# Patient Record
Sex: Female | Born: 1940 | Race: White | Hispanic: No | Smoking: Former smoker
Health system: Southern US, Community
[De-identification: ages and names within clinical notes are randomized; demographics above are authoritative.]

## PROBLEM LIST (undated history)

## (undated) DIAGNOSIS — Z5189 Encounter for other specified aftercare: Secondary | ICD-10-CM

## (undated) DIAGNOSIS — E785 Hyperlipidemia, unspecified: Secondary | ICD-10-CM

## (undated) DIAGNOSIS — H269 Unspecified cataract: Secondary | ICD-10-CM

## (undated) DIAGNOSIS — H409 Unspecified glaucoma: Secondary | ICD-10-CM

## (undated) DIAGNOSIS — T7840XA Allergy, unspecified, initial encounter: Secondary | ICD-10-CM

## (undated) DIAGNOSIS — M8580 Other specified disorders of bone density and structure, unspecified site: Secondary | ICD-10-CM

## (undated) DIAGNOSIS — M1990 Unspecified osteoarthritis, unspecified site: Secondary | ICD-10-CM

## (undated) DIAGNOSIS — K5900 Constipation, unspecified: Secondary | ICD-10-CM

## (undated) DIAGNOSIS — M810 Age-related osteoporosis without current pathological fracture: Secondary | ICD-10-CM

## (undated) DIAGNOSIS — I10 Essential (primary) hypertension: Secondary | ICD-10-CM

## (undated) HISTORY — DX: Allergy, unspecified, initial encounter: T78.40XA

## (undated) HISTORY — DX: Essential (primary) hypertension: I10

## (undated) HISTORY — DX: Other specified disorders of bone density and structure, unspecified site: M85.80

## (undated) HISTORY — DX: Unspecified glaucoma: H40.9

## (undated) HISTORY — DX: Age-related osteoporosis without current pathological fracture: M81.0

## (undated) HISTORY — DX: Constipation, unspecified: K59.00

## (undated) HISTORY — DX: Unspecified osteoarthritis, unspecified site: M19.90

## (undated) HISTORY — DX: Hyperlipidemia, unspecified: E78.5

## (undated) HISTORY — DX: Encounter for other specified aftercare: Z51.89

## (undated) HISTORY — DX: Unspecified cataract: H26.9

---

## 1972-07-26 HISTORY — PX: TUBAL LIGATION: SHX77

## 1989-07-26 HISTORY — PX: DILATION AND CURETTAGE OF UTERUS: SHX78

## 1999-06-23 ENCOUNTER — Encounter: Admission: RE | Admit: 1999-06-23 | Discharge: 1999-06-23 | Payer: Self-pay | Admitting: Obstetrics and Gynecology

## 1999-06-23 ENCOUNTER — Encounter: Payer: Self-pay | Admitting: Obstetrics and Gynecology

## 2000-06-24 ENCOUNTER — Encounter: Admission: RE | Admit: 2000-06-24 | Discharge: 2000-06-24 | Payer: Self-pay | Admitting: Internal Medicine

## 2000-06-24 ENCOUNTER — Encounter: Payer: Self-pay | Admitting: Internal Medicine

## 2001-06-26 ENCOUNTER — Encounter: Payer: Self-pay | Admitting: Obstetrics and Gynecology

## 2001-06-26 ENCOUNTER — Encounter: Admission: RE | Admit: 2001-06-26 | Discharge: 2001-06-26 | Payer: Self-pay | Admitting: Obstetrics and Gynecology

## 2002-07-04 ENCOUNTER — Encounter: Admission: RE | Admit: 2002-07-04 | Discharge: 2002-07-04 | Payer: Self-pay | Admitting: Obstetrics and Gynecology

## 2002-07-04 ENCOUNTER — Encounter: Payer: Self-pay | Admitting: Obstetrics and Gynecology

## 2003-07-12 ENCOUNTER — Encounter: Admission: RE | Admit: 2003-07-12 | Discharge: 2003-07-12 | Payer: Self-pay | Admitting: Obstetrics and Gynecology

## 2003-07-17 ENCOUNTER — Encounter: Admission: RE | Admit: 2003-07-17 | Discharge: 2003-07-17 | Payer: Self-pay | Admitting: Obstetrics and Gynecology

## 2004-08-06 ENCOUNTER — Encounter: Admission: RE | Admit: 2004-08-06 | Discharge: 2004-08-06 | Payer: Self-pay | Admitting: Obstetrics and Gynecology

## 2005-08-09 ENCOUNTER — Encounter: Admission: RE | Admit: 2005-08-09 | Discharge: 2005-08-09 | Payer: Self-pay | Admitting: Obstetrics and Gynecology

## 2006-08-10 ENCOUNTER — Encounter: Admission: RE | Admit: 2006-08-10 | Discharge: 2006-08-10 | Payer: Self-pay | Admitting: Obstetrics and Gynecology

## 2007-04-18 ENCOUNTER — Ambulatory Visit: Payer: Self-pay | Admitting: Internal Medicine

## 2007-08-31 ENCOUNTER — Encounter: Admission: RE | Admit: 2007-08-31 | Discharge: 2007-08-31 | Payer: Self-pay | Admitting: Obstetrics and Gynecology

## 2008-09-03 ENCOUNTER — Encounter: Admission: RE | Admit: 2008-09-03 | Discharge: 2008-09-03 | Payer: Self-pay | Admitting: Obstetrics and Gynecology

## 2009-03-24 ENCOUNTER — Ambulatory Visit: Payer: Self-pay | Admitting: Internal Medicine

## 2009-04-15 ENCOUNTER — Ambulatory Visit: Payer: Self-pay | Admitting: Internal Medicine

## 2009-04-15 ENCOUNTER — Encounter: Payer: Self-pay | Admitting: Internal Medicine

## 2009-04-15 HISTORY — PX: COLONOSCOPY: SHX174

## 2009-04-15 HISTORY — PX: POLYPECTOMY: SHX149

## 2009-04-17 ENCOUNTER — Encounter: Payer: Self-pay | Admitting: Internal Medicine

## 2009-09-08 ENCOUNTER — Encounter: Admission: RE | Admit: 2009-09-08 | Discharge: 2009-09-08 | Payer: Self-pay | Admitting: Obstetrics and Gynecology

## 2010-08-15 ENCOUNTER — Encounter: Payer: Self-pay | Admitting: Obstetrics and Gynecology

## 2010-08-15 ENCOUNTER — Other Ambulatory Visit: Payer: Self-pay | Admitting: Obstetrics and Gynecology

## 2010-08-15 DIAGNOSIS — Z1239 Encounter for other screening for malignant neoplasm of breast: Secondary | ICD-10-CM

## 2010-09-11 ENCOUNTER — Ambulatory Visit
Admission: RE | Admit: 2010-09-11 | Discharge: 2010-09-11 | Disposition: A | Discharge status: Home / Self Care | Payer: MEDICARE | Source: Ambulatory Visit | Attending: Obstetrics and Gynecology | Admitting: Obstetrics and Gynecology

## 2010-09-11 DIAGNOSIS — Z1239 Encounter for other screening for malignant neoplasm of breast: Secondary | ICD-10-CM

## 2011-08-09 ENCOUNTER — Other Ambulatory Visit: Payer: Self-pay | Admitting: Obstetrics and Gynecology

## 2011-08-09 DIAGNOSIS — Z1231 Encounter for screening mammogram for malignant neoplasm of breast: Secondary | ICD-10-CM

## 2011-09-14 ENCOUNTER — Ambulatory Visit
Admission: RE | Admit: 2011-09-14 | Discharge: 2011-09-14 | Disposition: A | Discharge status: Home / Self Care | Payer: Medicare Other | Source: Ambulatory Visit | Attending: Obstetrics and Gynecology | Admitting: Obstetrics and Gynecology

## 2011-09-14 DIAGNOSIS — Z1231 Encounter for screening mammogram for malignant neoplasm of breast: Secondary | ICD-10-CM

## 2011-09-21 ENCOUNTER — Other Ambulatory Visit: Payer: Self-pay | Admitting: Obstetrics and Gynecology

## 2011-09-21 DIAGNOSIS — Z803 Family history of malignant neoplasm of breast: Secondary | ICD-10-CM

## 2011-09-28 ENCOUNTER — Ambulatory Visit
Admission: RE | Admit: 2011-09-28 | Discharge: 2011-09-28 | Disposition: A | Discharge status: Home / Self Care | Payer: Medicare Other | Source: Ambulatory Visit | Attending: Obstetrics and Gynecology | Admitting: Obstetrics and Gynecology

## 2011-09-28 DIAGNOSIS — Z803 Family history of malignant neoplasm of breast: Secondary | ICD-10-CM

## 2011-09-28 MED ORDER — GADOBENATE DIMEGLUMINE 529 MG/ML IV SOLN
10.0000 mL | Freq: Once | INTRAVENOUS | Status: AC | PRN
  Administered 2011-09-28: 10 mL via INTRAVENOUS

## 2012-08-28 ENCOUNTER — Other Ambulatory Visit: Payer: Self-pay | Admitting: Obstetrics and Gynecology

## 2012-08-28 DIAGNOSIS — Z1231 Encounter for screening mammogram for malignant neoplasm of breast: Secondary | ICD-10-CM

## 2012-10-03 ENCOUNTER — Ambulatory Visit
Admission: RE | Admit: 2012-10-03 | Discharge: 2012-10-03 | Disposition: A | Discharge status: Home / Self Care | Payer: Medicare Other | Source: Ambulatory Visit | Attending: Obstetrics and Gynecology | Admitting: Obstetrics and Gynecology

## 2012-10-03 DIAGNOSIS — Z1231 Encounter for screening mammogram for malignant neoplasm of breast: Secondary | ICD-10-CM

## 2012-10-04 ENCOUNTER — Other Ambulatory Visit: Payer: Self-pay | Admitting: Obstetrics and Gynecology

## 2012-10-04 DIAGNOSIS — R928 Other abnormal and inconclusive findings on diagnostic imaging of breast: Secondary | ICD-10-CM

## 2012-10-10 ENCOUNTER — Ambulatory Visit
Admission: RE | Admit: 2012-10-10 | Discharge: 2012-10-10 | Disposition: A | Discharge status: Home / Self Care | Payer: Medicare Other | Source: Ambulatory Visit | Attending: Obstetrics and Gynecology | Admitting: Obstetrics and Gynecology

## 2012-10-10 DIAGNOSIS — R928 Other abnormal and inconclusive findings on diagnostic imaging of breast: Secondary | ICD-10-CM

## 2013-09-05 ENCOUNTER — Other Ambulatory Visit: Payer: Self-pay

## 2013-09-05 DIAGNOSIS — Z1231 Encounter for screening mammogram for malignant neoplasm of breast: Secondary | ICD-10-CM

## 2013-10-12 ENCOUNTER — Ambulatory Visit
Admission: RE | Admit: 2013-10-12 | Discharge: 2013-10-12 | Disposition: A | Discharge status: Home / Self Care | Payer: Medicare Other | Source: Ambulatory Visit

## 2013-10-12 DIAGNOSIS — Z1231 Encounter for screening mammogram for malignant neoplasm of breast: Secondary | ICD-10-CM

## 2014-02-21 ENCOUNTER — Encounter: Payer: Self-pay | Admitting: Internal Medicine

## 2014-04-26 ENCOUNTER — Encounter: Payer: Self-pay | Admitting: Internal Medicine

## 2014-08-05 ENCOUNTER — Encounter: Payer: Self-pay | Admitting: Internal Medicine

## 2014-09-10 ENCOUNTER — Other Ambulatory Visit: Payer: Self-pay

## 2014-09-10 DIAGNOSIS — Z1231 Encounter for screening mammogram for malignant neoplasm of breast: Secondary | ICD-10-CM

## 2014-09-13 ENCOUNTER — Ambulatory Visit (AMBULATORY_SURGERY_CENTER): Payer: Self-pay | Admitting: *Deleted

## 2014-09-13 VITALS — Ht 62.0 in | Wt 133.0 lb

## 2014-09-13 DIAGNOSIS — Z86010 Personal history of colonic polyps: Secondary | ICD-10-CM

## 2014-09-13 MED ORDER — MOVIPREP 100 G PO SOLR: 1.0000 | Freq: Once | ORAL | Status: DC

## 2014-09-13 NOTE — Progress Notes (Signed)
No egg or soy allergy No diet pills No home 02 use No issues with past sedation. Pt states she has hard stools, she has 5-6 a week and doesn't take much for her constipation to go. Pt declined emmi video

## 2014-09-17 ENCOUNTER — Telehealth: Payer: Self-pay | Admitting: Internal Medicine

## 2014-09-17 NOTE — Telephone Encounter (Signed)
Pt states prep is 86.00 with her insurance and she cannot swing that price .  Called pharmacy and gave them coupon information [id number]      [id number]        [id number]     IO=[phone]     Pt aware this was called and for her to pick up at no cost to her   Marijean Niemann

## 2014-09-27 ENCOUNTER — Encounter: Payer: Self-pay | Admitting: Internal Medicine

## 2014-09-27 ENCOUNTER — Ambulatory Visit (AMBULATORY_SURGERY_CENTER): Payer: Medicare Other | Admitting: Internal Medicine

## 2014-09-27 VITALS — BP 121/64 | HR 57 | Temp 98.3°F | Resp 24 | Ht 62.0 in | Wt 133.0 lb

## 2014-09-27 DIAGNOSIS — D122 Benign neoplasm of ascending colon: Secondary | ICD-10-CM

## 2014-09-27 DIAGNOSIS — Z86010 Personal history of colonic polyps: Secondary | ICD-10-CM

## 2014-09-27 MED ORDER — SODIUM CHLORIDE 0.9 % IV SOLN: 500.0000 mL | INTRAVENOUS | Status: DC

## 2014-09-27 NOTE — Patient Instructions (Signed)
Discharge instructions given. Handout on polyps. Resume previous medications. YOU HAD AN ENDOSCOPIC PROCEDURE TODAY AT THE [facility]:   Refer to the procedure report that was given to you for any specific questions about what was found during the examination.  If the procedure report does not answer your questions, please call your gastroenterologist to clarify.  If you requested that your care partner not be given the details of your procedure findings, then the procedure report has been included in a sealed envelope for you to review at your convenience later.  YOU SHOULD EXPECT: Some feelings of bloating in the abdomen. Passage of more gas than usual.  Walking can help get rid of the air that was put into your GI tract during the procedure and reduce the bloating. If you had a lower endoscopy (such as a colonoscopy or flexible sigmoidoscopy) you may notice spotting of blood in your stool or on the toilet paper. If you underwent a bowel prep for your procedure, you may not have a normal bowel movement for a few days.  Please Note:  You might notice some irritation and congestion in your nose or some drainage.  This is from the oxygen used during your procedure.  There is no need for concern and it should clear up in a day or so.  SYMPTOMS TO REPORT IMMEDIATELY:   Following lower endoscopy (colonoscopy or flexible sigmoidoscopy):  Excessive amounts of blood in the stool  Significant tenderness or worsening of abdominal pains  Swelling of the abdomen that is new, acute  Fever of 100F or higher   For urgent or emergent issues, a gastroenterologist can be reached at any hour by calling [phone].   DIET: Your first meal following the procedure should be a small meal and then it is ok to progress to your normal diet. Heavy or fried foods are harder to digest and may make you feel nauseous or bloated.  Likewise, meals heavy in dairy and vegetables can increase bloating.  Drink  plenty of fluids but you should avoid alcoholic beverages for 24 hours.  ACTIVITY:  You should plan to take it easy for the rest of today and you should NOT DRIVE or use heavy machinery until tomorrow (because of the sedation medicines used during the test).    FOLLOW UP: Our staff will call the number listed on your records the next business day following your procedure to check on you and address any questions or concerns that you may have regarding the information given to you following your procedure. If we do not reach you, we will leave a message.  However, if you are feeling well and you are not experiencing any problems, there is no need to return our call.  We will assume that you have returned to your regular daily activities without incident.  If any biopsies were taken you will be contacted by phone or by letter within the next 1-3 weeks.  Please call us at [phone] if you have not heard about the biopsies in 3 weeks.    SIGNATURES/CONFIDENTIALITY: You and/or your care partner have signed paperwork which will be entered into your electronic medical record.  These signatures attest to the fact that that the information above on your After Visit Summary has been reviewed and is understood.  Full responsibility of the confidentiality of this discharge information lies with you and/or your care-partner. 

## 2014-09-27 NOTE — Op Note (Signed)
[place]  [address] [place], [address]   COLONOSCOPY PROCEDURE REPORT  PATIENT: Susan Boyd, Susan Boyd  MR#: [id number] BIRTHDATE: 1940-12-16 , 73  yrs. old GENDER: female ENDOSCOPIST: Eustace Quail, MD REFERRED [id number] Program Recall PROCEDURE DATE:  09/27/2014 PROCEDURE:   Colonoscopy with snare polypectomy x 1 First Screening Colonoscopy - Avg.  risk and is 50 yrs.  old or older - No.  Prior Negative Screening - Now for repeat screening. N/A  History of Adenoma - Now for follow-up colonoscopy & has been > or = to 3 yrs.  Yes hx of adenoma.  Has been 3 or more years since last colonoscopy.  Polyps Removed Today? Yes. ASA CLASS:   Class II INDICATIONS:follow up of adenomatous colonic polyp(s). . Index exam 2003 (negative); 2010 (tubular adenoma) MEDICATIONS: Monitored anesthesia care and Propofol 240 mg IV  DESCRIPTION OF PROCEDURE:   After the risks benefits and alternatives of the procedure were thoroughly explained, informed consent was obtained.  The digital rectal exam revealed no abnormalities of the rectum.   The [id number]  endoscope was introduced through the anus and advanced to the cecum, which was identified by both the appendix and ileocecal valve. No adverse events experienced.   The quality of the prep was excellent, using MoviPrep  The instrument was then slowly withdrawn as the colon was fully examined.    COLON FINDINGS: A single polyp measuring 2 mm in size was found in the ascending colon.  A polypectomy was performed with a cold snare.  The resection was complete, the polyp tissue was completely retrieved and sent to histology.   The examination was otherwise normal.  Retroflexed views revealed no abnormalities. The time to cecum=4 minutes 51 seconds.  Withdrawal time=9 minutes 0 seconds. The scope was withdrawn and the procedure completed. COMPLICATIONS: There were no immediate complications.  ENDOSCOPIC  IMPRESSION: 1.   Single polyp measuring 2 mm in size was found in the ascending colon; polypectomy was performed with a cold snare 2.   The examination was otherwise normal  RECOMMENDATIONS: 1. Follow up colonoscopy in 5 years  eSigned:  Eustace Quail, MD 09/27/2014 10:46 AM   cc: Burnard Bunting, MD and The Patient

## 2014-09-27 NOTE — Progress Notes (Signed)
To recovery, report to McCoy, RN, VSS 

## 2014-09-27 NOTE — Progress Notes (Signed)
Called to room to assist during endoscopic procedure.  Patient ID and intended procedure confirmed with present staff. Received instructions for my participation in the procedure from the performing physician.  

## 2014-09-30 ENCOUNTER — Telehealth: Payer: Self-pay

## 2014-09-30 NOTE — Telephone Encounter (Signed)
  Follow up Call-  Call back number 09/27/2014  Post procedure Call Back phone  # [phone]  Permission to leave phone message Yes     Patient questions:  Do you have a fever, pain , or abdominal swelling? No. Pain Score  0 *  Have you tolerated food without any problems? Yes.    Have you been able to return to your normal activities? Yes.    Do you have any questions about your discharge instructions: Diet   No. Medications  No. Follow up visit  No.  Do you have questions or concerns about your Care? No.  Actions: * If pain score is 4 or above: No action needed, pain <4.

## 2014-10-01 ENCOUNTER — Encounter: Payer: Self-pay | Admitting: Internal Medicine

## 2014-10-15 ENCOUNTER — Ambulatory Visit
Admission: RE | Admit: 2014-10-15 | Discharge: 2014-10-15 | Disposition: A | Discharge status: Home / Self Care | Payer: Medicare Other | Source: Ambulatory Visit

## 2014-10-15 ENCOUNTER — Other Ambulatory Visit: Payer: Self-pay

## 2014-10-15 DIAGNOSIS — Z1231 Encounter for screening mammogram for malignant neoplasm of breast: Secondary | ICD-10-CM

## 2015-09-08 ENCOUNTER — Other Ambulatory Visit: Payer: Self-pay

## 2015-09-08 DIAGNOSIS — Z1231 Encounter for screening mammogram for malignant neoplasm of breast: Secondary | ICD-10-CM

## 2015-10-20 ENCOUNTER — Ambulatory Visit
Admission: RE | Admit: 2015-10-20 | Discharge: 2015-10-20 | Disposition: A | Discharge status: Home / Self Care | Payer: Medicare Other | Source: Ambulatory Visit

## 2015-10-20 DIAGNOSIS — Z1231 Encounter for screening mammogram for malignant neoplasm of breast: Secondary | ICD-10-CM

## 2015-10-22 ENCOUNTER — Other Ambulatory Visit: Payer: Self-pay | Admitting: Obstetrics and Gynecology

## 2015-10-22 DIAGNOSIS — R928 Other abnormal and inconclusive findings on diagnostic imaging of breast: Secondary | ICD-10-CM

## 2015-10-30 ENCOUNTER — Ambulatory Visit
Admission: RE | Admit: 2015-10-30 | Discharge: 2015-10-30 | Disposition: A | Discharge status: Home / Self Care | Payer: Medicare Other | Source: Ambulatory Visit | Attending: Obstetrics and Gynecology | Admitting: Obstetrics and Gynecology

## 2015-10-30 DIAGNOSIS — R928 Other abnormal and inconclusive findings on diagnostic imaging of breast: Secondary | ICD-10-CM

## 2016-09-16 ENCOUNTER — Other Ambulatory Visit: Payer: Self-pay | Admitting: Internal Medicine

## 2016-09-16 DIAGNOSIS — Z1231 Encounter for screening mammogram for malignant neoplasm of breast: Secondary | ICD-10-CM

## 2016-11-02 ENCOUNTER — Ambulatory Visit
Admission: RE | Admit: 2016-11-02 | Discharge: 2016-11-02 | Disposition: A | Discharge status: Home / Self Care | Payer: Medicare Other | Source: Ambulatory Visit | Attending: Internal Medicine | Admitting: Internal Medicine

## 2016-11-02 DIAGNOSIS — Z1231 Encounter for screening mammogram for malignant neoplasm of breast: Secondary | ICD-10-CM

## 2017-09-22 ENCOUNTER — Other Ambulatory Visit: Payer: Self-pay | Admitting: Internal Medicine

## 2017-09-22 DIAGNOSIS — Z1231 Encounter for screening mammogram for malignant neoplasm of breast: Secondary | ICD-10-CM

## 2017-11-15 ENCOUNTER — Ambulatory Visit
Admission: RE | Admit: 2017-11-15 | Discharge: 2017-11-15 | Disposition: A | Discharge status: Home / Self Care | Payer: Medicare Other | Source: Ambulatory Visit | Attending: Internal Medicine | Admitting: Internal Medicine

## 2017-11-15 DIAGNOSIS — Z1231 Encounter for screening mammogram for malignant neoplasm of breast: Secondary | ICD-10-CM

## 2018-10-04 ENCOUNTER — Other Ambulatory Visit: Payer: Self-pay | Admitting: Internal Medicine

## 2018-10-04 DIAGNOSIS — Z1231 Encounter for screening mammogram for malignant neoplasm of breast: Secondary | ICD-10-CM

## 2018-11-17 ENCOUNTER — Ambulatory Visit: Payer: Medicare Other

## 2019-01-10 ENCOUNTER — Ambulatory Visit
Admission: RE | Admit: 2019-01-10 | Discharge: 2019-01-10 | Disposition: A | Discharge status: Home / Self Care | Payer: Medicare Other | Source: Ambulatory Visit | Attending: Internal Medicine | Admitting: Internal Medicine

## 2019-01-10 ENCOUNTER — Other Ambulatory Visit: Payer: Self-pay

## 2019-01-10 DIAGNOSIS — Z1231 Encounter for screening mammogram for malignant neoplasm of breast: Secondary | ICD-10-CM

## 2019-08-25 ENCOUNTER — Ambulatory Visit: Payer: Medicare Other

## 2019-08-27 ENCOUNTER — Ambulatory Visit: Payer: Medicare Other

## 2019-08-31 ENCOUNTER — Ambulatory Visit: Payer: Medicare Other | Attending: Internal Medicine

## 2019-08-31 DIAGNOSIS — Z23 Encounter for immunization: Secondary | ICD-10-CM | POA: Insufficient documentation

## 2019-08-31 NOTE — Progress Notes (Signed)
   Covid-19 Vaccination Clinic  Name:  Susan Boyd    MRN: [id number] DOB: 1941/01/17  08/31/2019  [name] was observed post Covid-19 immunization for 15 minutes without incidence. She was provided with Vaccine Information Sheet and instruction to access the V-Safe system.   [name] was instructed to call 911 with any severe reactions post vaccine: Marland Kitchen Difficulty breathing  . Swelling of your face and throat  . A fast heartbeat  . A bad rash all over your body  . Dizziness and weakness    Immunizations Administered    Name Date Dose VIS Date Route   Pfizer COVID-19 Vaccine 08/31/2019  2:08 PM 0.3 mL 07/06/2019 Intramuscular   Manufacturer: [place]   Lot: [id number]   [place]: [id number]

## 2019-09-25 ENCOUNTER — Ambulatory Visit: Payer: Medicare Other | Attending: Internal Medicine

## 2019-09-25 DIAGNOSIS — Z23 Encounter for immunization: Secondary | ICD-10-CM

## 2019-09-25 NOTE — Progress Notes (Signed)
   Covid-19 Vaccination Clinic  Name:  Susan Boyd    MRN: [id number] DOB: 03/23/1941  09/25/2019  Susan Boyd was observed post Covid-19 immunization for 15 minutes without incident. She was provided with Vaccine Information Sheet and instruction to access the V-Safe system.   Susan Boyd was instructed to call 911 with any severe reactions post vaccine: Marland Kitchen Difficulty breathing  . Swelling of face and throat  . A fast heartbeat  . A bad rash all over body  . Dizziness and weakness   Immunizations Administered    Name Date Dose VIS Date Route   Pfizer COVID-19 Vaccine 09/25/2019  2:04 PM 0.3 mL 07/06/2019 Intramuscular   Manufacturer: [place]   Lot: [id number]   [place]: [id number]

## 2019-11-08 ENCOUNTER — Encounter: Payer: Self-pay | Admitting: Internal Medicine

## 2019-12-03 ENCOUNTER — Other Ambulatory Visit: Payer: Self-pay | Admitting: Internal Medicine

## 2019-12-03 DIAGNOSIS — Z1231 Encounter for screening mammogram for malignant neoplasm of breast: Secondary | ICD-10-CM

## 2019-12-12 ENCOUNTER — Ambulatory Visit (AMBULATORY_SURGERY_CENTER): Payer: Self-pay | Admitting: *Deleted

## 2019-12-12 ENCOUNTER — Other Ambulatory Visit: Payer: Self-pay

## 2019-12-12 VITALS — Temp 98.1°F | Ht 62.0 in | Wt 134.0 lb

## 2019-12-12 DIAGNOSIS — Z86010 Personal history of colonic polyps: Secondary | ICD-10-CM

## 2019-12-12 MED ORDER — NA SULFATE-K SULFATE-MG SULF 17.5-3.13-1.6 GM/177ML PO SOLN: 1.0000 | Freq: Once | ORAL | 0 refills | Status: AC

## 2019-12-12 NOTE — Progress Notes (Signed)

## 2019-12-14 ENCOUNTER — Encounter: Payer: Self-pay | Admitting: Internal Medicine

## 2019-12-26 ENCOUNTER — Other Ambulatory Visit: Payer: Self-pay

## 2019-12-26 ENCOUNTER — Encounter: Payer: Self-pay | Admitting: Internal Medicine

## 2019-12-26 ENCOUNTER — Ambulatory Visit (AMBULATORY_SURGERY_CENTER): Payer: Medicare Other | Admitting: Internal Medicine

## 2019-12-26 VITALS — BP 144/58 | HR 56 | Temp 97.3°F | Resp 11 | Ht 62.0 in | Wt 132.0 lb

## 2019-12-26 DIAGNOSIS — Z86010 Personal history of colonic polyps: Secondary | ICD-10-CM

## 2019-12-26 MED ORDER — SODIUM CHLORIDE 0.9 % IV SOLN: 500.0000 mL | Freq: Once | INTRAVENOUS | Status: DC

## 2019-12-26 NOTE — Progress Notes (Signed)
Vitals-DT  Pt's states no medical or surgical changes since previsit or office visit.  

## 2019-12-26 NOTE — Patient Instructions (Signed)
YOU HAD AN ENDOSCOPIC PROCEDURE TODAY AT THE [facility]:   Refer to the procedure report that was given to you for any specific questions about what was found during the examination.  If the procedure report does not answer your questions, please call your gastroenterologist to clarify.  If you requested that your care partner not be given the details of your procedure findings, then the procedure report has been included in a sealed envelope for you to review at your convenience later.  YOU SHOULD EXPECT: Some feelings of bloating in the abdomen. Passage of more gas than usual.  Walking can help get rid of the air that was put into your GI tract during the procedure and reduce the bloating. If you had a lower endoscopy (such as a colonoscopy or flexible sigmoidoscopy) you may notice spotting of blood in your stool or on the toilet paper. If you underwent a bowel prep for your procedure, you may not have a normal bowel movement for a few days.  Please Note:  You might notice some irritation and congestion in your nose or some drainage.  This is from the oxygen used during your procedure.  There is no need for concern and it should clear up in a day or so.  SYMPTOMS TO REPORT IMMEDIATELY:  Following lower endoscopy (colonoscopy or flexible sigmoidoscopy):  Excessive amounts of blood in the stool  Significant tenderness or worsening of abdominal pains  Swelling of the abdomen that is new, acute  Fever of 100F or higher    For urgent or emergent issues, a gastroenterologist can be reached at any hour by calling [phone]. Do not use MyChart messaging for urgent concerns.    DIET:  We do recommend a small meal at first, but then you may proceed to your regular diet.  Drink plenty of fluids but you should avoid alcoholic beverages for 24 hours.  ACTIVITY:  You should plan to take it easy for the rest of today and you should NOT DRIVE or use heavy machinery until tomorrow (because  of the sedation medicines used during the test).    FOLLOW UP: Our staff will call the number listed on your records 48-72 hours following your procedure to check on you and address any questions or concerns that you may have regarding the information given to you following your procedure. If we do not reach you, we will leave a message.  We will attempt to reach you two times.  During this call, we will ask if you have developed any symptoms of COVID 19. If you develop any symptoms (ie: fever, flu-like symptoms, shortness of breath, cough etc.) before then, please call [phone].  If you test positive for Covid 19 in the 2 weeks post procedure, please call and report this information to us.    If any biopsies were taken you will be contacted by phone or by letter within the next 1-3 weeks.  Please call us at [phone] if you have not heard about the biopsies in 3 weeks.    SIGNATURES/CONFIDENTIALITY: You and/or your care partner have signed paperwork which will be entered into your electronic medical record.  These signatures attest to the fact that that the information above on your After Visit Summary has been reviewed and is understood.  Full responsibility of the confidentiality of this discharge information lies with you and/or your care-partner.    Resume medications. 

## 2019-12-26 NOTE — Op Note (Signed)
[facility] Patient Name: Susan Boyd Procedure Date: 12/26/2019 11:41 AM MRN: [id number] Endoscopist: Docia Chuck. Henrene Pastor , MD Age: 79 Referring MD:  Date of Birth: May 10, 1941 Gender: Female Account #: [id number] Procedure:                Colonoscopy Indications:              High risk colon cancer surveillance: Personal                            history of non-advanced adenomas. Previous                            examinations 2003, 2010, 2016 Medicines:                Monitored Anesthesia Care Procedure:                Pre-Anesthesia Assessment:                           - Prior to the procedure, a History and Physical                            was performed, and patient medications and                            allergies were reviewed. The patient's tolerance of                            previous anesthesia was also reviewed. The risks                            and benefits of the procedure and the sedation                            options and risks were discussed with the patient.                            All questions were answered, and informed consent                            was obtained. Prior Anticoagulants: The patient has                            taken no previous anticoagulant or antiplatelet                            agents. ASA Grade Assessment: II - A patient with                            mild systemic disease. After reviewing the risks                            and benefits, the patient was deemed in  satisfactory condition to undergo the procedure.                           After obtaining informed consent, the colonoscope                            was passed under direct vision. Throughout the                            procedure, the patient's blood pressure, pulse, and                            oxygen saturations were monitored continuously. The                            Colonoscope was introduced through the anus  and                            advanced to the the cecum, identified by                            appendiceal orifice and ileocecal valve. The                            ileocecal valve, appendiceal orifice, and rectum                            were photographed. The quality of the bowel                            preparation was excellent. The colonoscopy was                            performed without difficulty. The patient tolerated                            the procedure well. The bowel preparation used was                            SUPREP via split dose instruction. Scope In: 11:44:50 AM Scope Out: 11:56:02 AM Scope Withdrawal Time: 0 hours 6 minutes 49 seconds  Total Procedure Duration: 0 hours 11 minutes 12 seconds  Findings:                 The entire examined colon appeared normal on direct                            and retroflexion views. Complications:            No immediate complications. Estimated blood loss:                            None. Estimated Blood Loss:     Estimated blood loss: none. Impression:               - The entire examined  colon is normal on direct and                            retroflexion views.                           - No specimens collected. Recommendation:           - Repeat colonoscopy is not recommended for                            surveillance.                           - Patient has a contact number available for                            emergencies. The signs and symptoms of potential                            delayed complications were discussed with the                            patient. Return to normal activities tomorrow.                            Written discharge instructions were provided to the                            patient.                           - Resume previous diet.                           - Continue present medications. Docia Chuck. Henrene Pastor, MD 12/26/2019 12:00:19 PM This report has been signed  electronically.

## 2019-12-26 NOTE — Progress Notes (Signed)
To PACU, VSS. Report to [email or web address] 

## 2019-12-28 ENCOUNTER — Telehealth: Payer: Self-pay

## 2019-12-28 NOTE — Telephone Encounter (Signed)
°  Follow up Call-  Call back number 12/26/2019  Post procedure Call Back phone  # [phone]  Permission to leave phone message Yes  Some recent data might be hidden     Patient questions:  Do you have a fever, pain , or abdominal swelling? No. Pain Score  0 *  Have you tolerated food without any problems? Yes.    Have you been able to return to your normal activities? Yes.    Do you have any questions about your discharge instructions: Diet   No. Medications  No. Follow up visit  No.  Do you have questions or concerns about your Care? No.  Actions: * If pain score is 4 or above: No action needed, pain <4.   1. Have you developed a fever since your procedure? no  2.   Have you had an respiratory symptoms (SOB or cough) since your procedure? no  3.   Have you tested positive for COVID 19 since your procedure No  4.   Have you had any family members/close contacts diagnosed with the COVID 19 since your procedure?  No   If yes to any of these questions please route to Joylene John, RN and Erenest Rasher, RN

## 2020-01-11 ENCOUNTER — Other Ambulatory Visit: Payer: Self-pay

## 2020-01-11 ENCOUNTER — Ambulatory Visit
Admission: RE | Admit: 2020-01-11 | Discharge: 2020-01-11 | Disposition: A | Discharge status: Home / Self Care | Payer: Medicare Other | Source: Ambulatory Visit | Attending: Internal Medicine | Admitting: Internal Medicine

## 2020-01-11 DIAGNOSIS — Z1231 Encounter for screening mammogram for malignant neoplasm of breast: Secondary | ICD-10-CM

## 2020-05-10 ENCOUNTER — Ambulatory Visit: Payer: Medicare Other | Attending: Internal Medicine

## 2020-05-10 DIAGNOSIS — Z23 Encounter for immunization: Secondary | ICD-10-CM

## 2020-05-10 NOTE — Progress Notes (Signed)
° °  Covid-19 Vaccination Clinic  Name:  Susan Boyd    MRN: [id number] DOB: 12-10-1940  05/10/2020  [name] was observed post Covid-19 immunization for 15 minutes without incident. She was provided with Vaccine Information Sheet and instruction to access the V-Safe system.   [name] was instructed to call 911 with any severe reactions post vaccine:  Difficulty breathing   Swelling of face and throat   A fast heartbeat   A bad rash all over body   Dizziness and weakness

## 2020-12-10 ENCOUNTER — Other Ambulatory Visit: Payer: Self-pay | Admitting: Internal Medicine

## 2020-12-10 DIAGNOSIS — Z1231 Encounter for screening mammogram for malignant neoplasm of breast: Secondary | ICD-10-CM

## 2021-02-09 ENCOUNTER — Ambulatory Visit
Admission: RE | Admit: 2021-02-09 | Discharge: 2021-02-09 | Disposition: A | Discharge status: Home / Self Care | Payer: Medicare Other | Source: Ambulatory Visit | Attending: Internal Medicine | Admitting: Internal Medicine

## 2021-02-09 ENCOUNTER — Other Ambulatory Visit: Payer: Self-pay

## 2021-02-09 DIAGNOSIS — Z1231 Encounter for screening mammogram for malignant neoplasm of breast: Secondary | ICD-10-CM

## 2021-02-11 ENCOUNTER — Other Ambulatory Visit: Payer: Self-pay | Admitting: Internal Medicine

## 2021-02-11 DIAGNOSIS — R928 Other abnormal and inconclusive findings on diagnostic imaging of breast: Secondary | ICD-10-CM

## 2021-03-02 ENCOUNTER — Ambulatory Visit
Admission: RE | Admit: 2021-03-02 | Discharge: 2021-03-02 | Disposition: A | Discharge status: Home / Self Care | Payer: Medicare Other | Source: Ambulatory Visit | Attending: Internal Medicine | Admitting: Internal Medicine

## 2021-03-02 ENCOUNTER — Other Ambulatory Visit: Payer: Self-pay

## 2021-03-02 ENCOUNTER — Other Ambulatory Visit: Payer: Self-pay | Admitting: Internal Medicine

## 2021-03-02 ENCOUNTER — Ambulatory Visit: Payer: Medicare Other

## 2021-03-02 DIAGNOSIS — R928 Other abnormal and inconclusive findings on diagnostic imaging of breast: Secondary | ICD-10-CM

## 2021-09-04 ENCOUNTER — Other Ambulatory Visit: Payer: Medicare Other

## 2022-02-21 IMAGING — US US BREAST*L* LIMITED INC AXILLA
1 series · 4 of 4 positions shown · non-contrast
Comparison: Previous exam(s).

CLINICAL DATA: Patient recalled from screening for right breast
asymmetry and left breast mass.



[Series 1: us breast*left* limited inc axilla · 0.06mm/px · 4 of 4 slices shown]
[im 1/4]
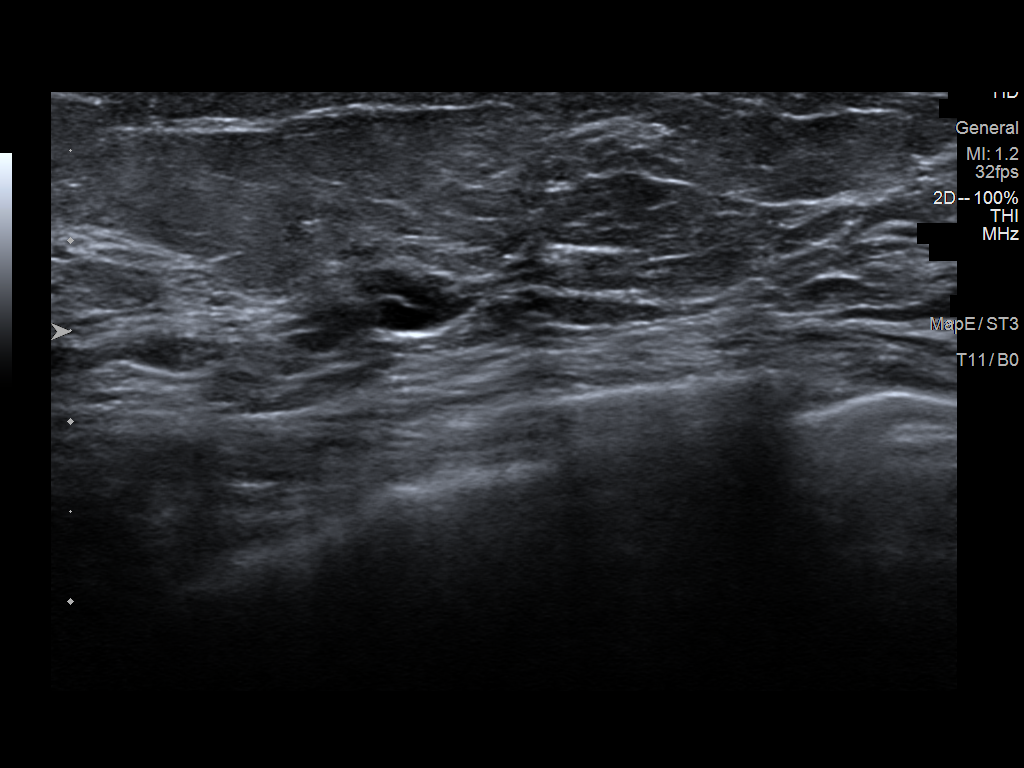
[im 2/4]
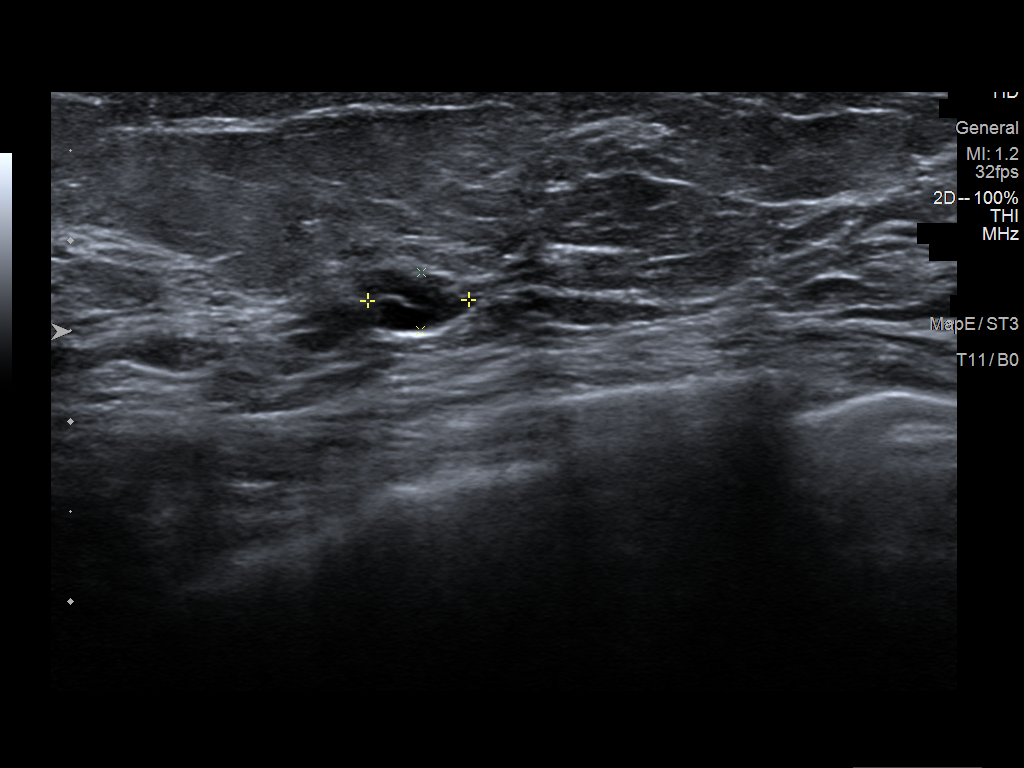
[im 3/4]
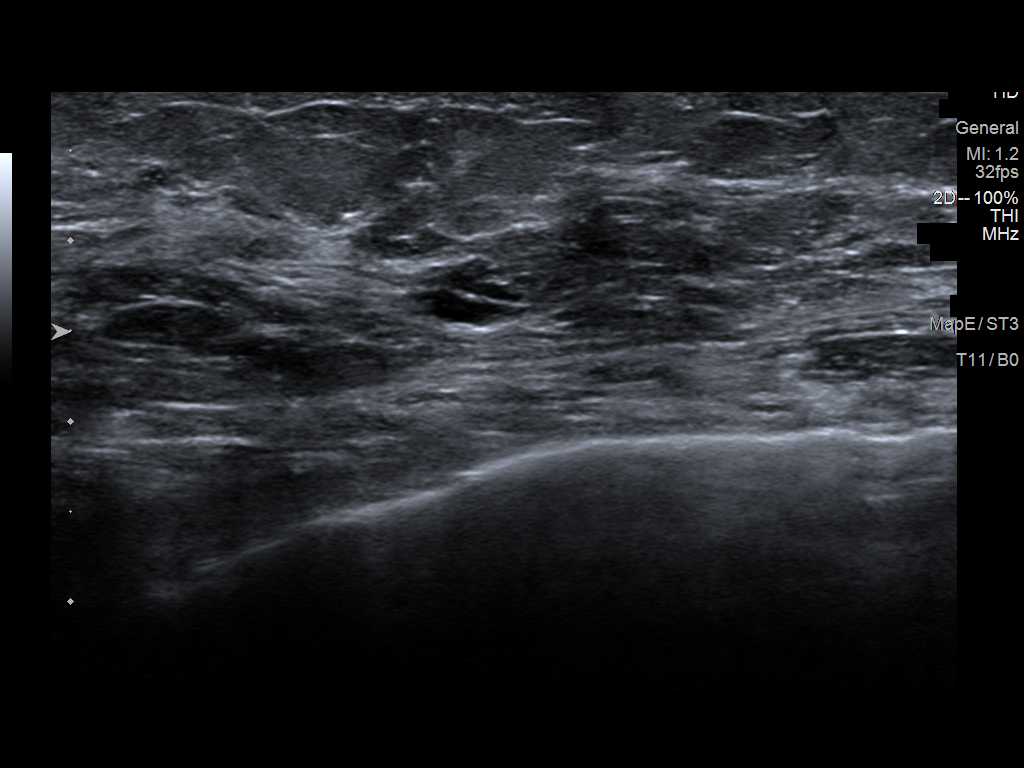
[im 4/4]
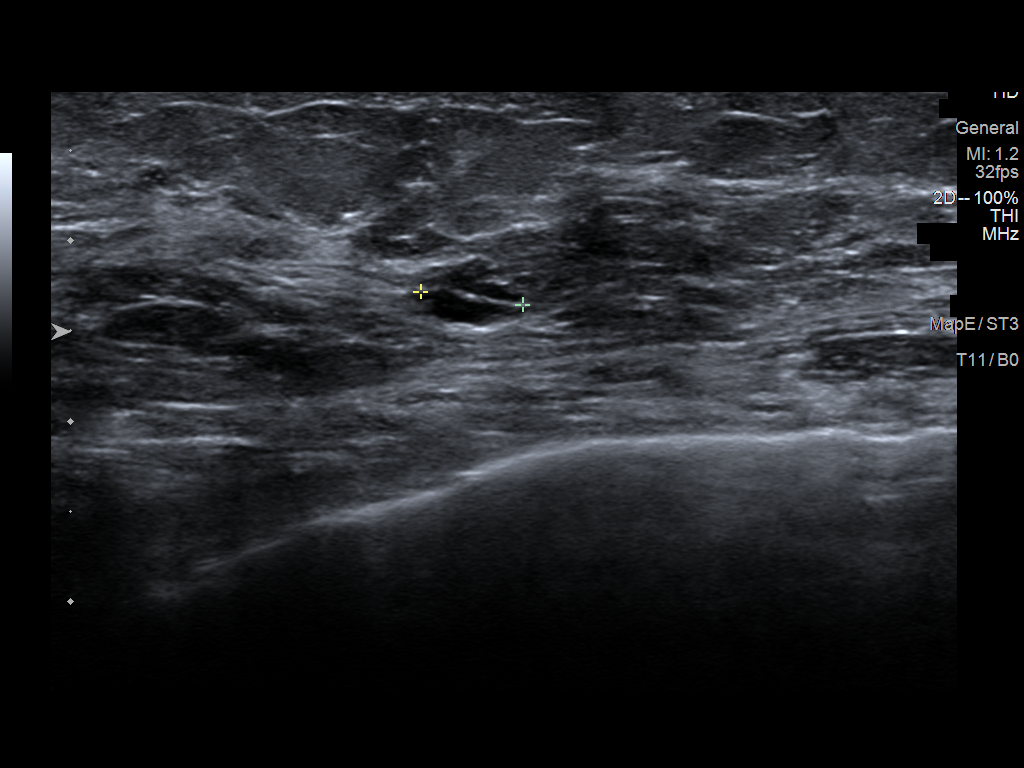

[4 of 4 positions shown; findings below may reference images not displayed]

ACR Breast Density Category c: The breast tissue is heterogeneously
dense, which may obscure small masses.
FINDINGS: Questioned asymmetry within the outer right breast partially effaced
with additional imaging suggestive of dense fibroglandular tissue.

There is a persistent lobular circumscribed mass within the outer
left breast posterior depth.

Targeted ultrasound is performed, showing a 6 x 3 x 6 mm cluster of
cysts left breast 3 o'clock position 7 cm from nipple.

Within the right breast 11 o'clock position, dense tissue is
visualized.
IMPRESSION: Probably benign left breast mass 3 o'clock position favored to
represent a cluster of cysts.

RECOMMENDATION:
Left breast ultrasound in 6 months to reassess the probably benign
left breast mass.

I have discussed the findings and recommendations with the patient.
If applicable, a reminder letter will be sent to the patient
regarding the next appointment.

BI-RADS CATEGORY  3: Probably benign.

## 2022-02-21 IMAGING — MG DIGITAL DIAGNOSTIC BILAT W/ TOMO W/ CAD
8 series · 8 of 24 positions shown · non-contrast
Comparison: Previous exam(s).

CLINICAL DATA: Patient recalled from screening for right breast
asymmetry and left breast mass.



[L CC synth-2D]
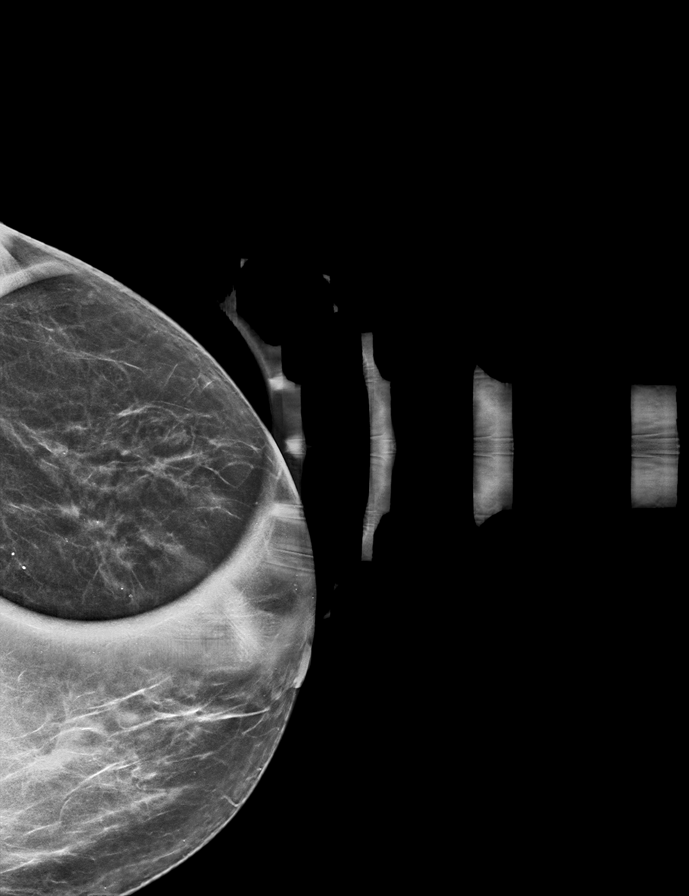

[L MLO synth-2D]
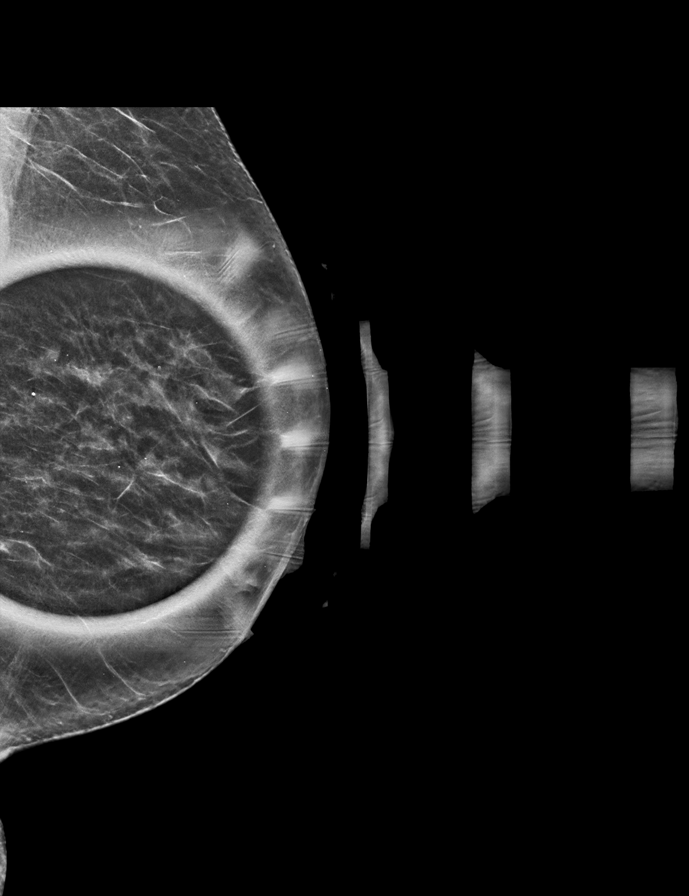

[R CC synth-2D]
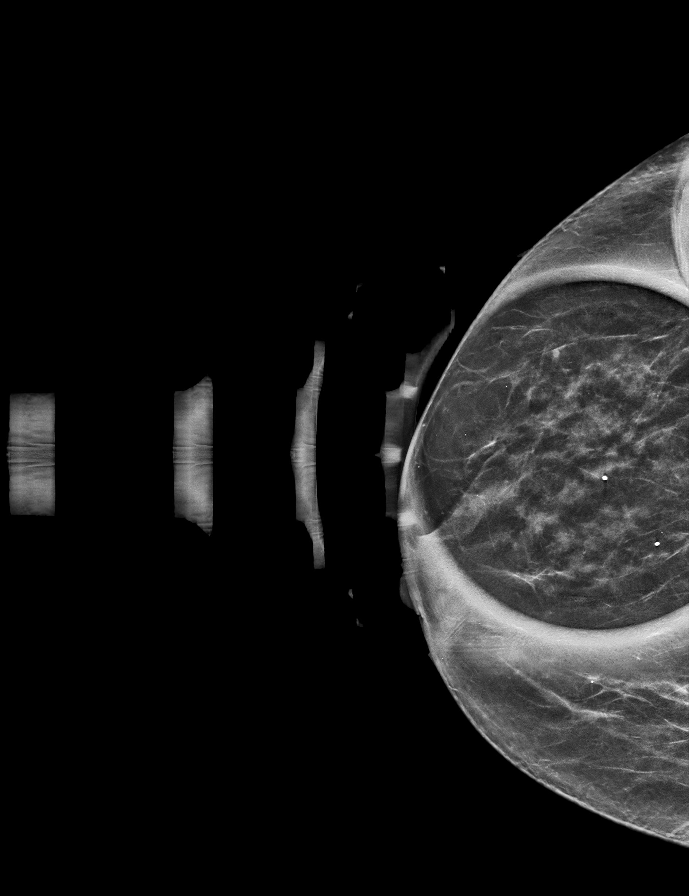

[R MLO synth-2D]
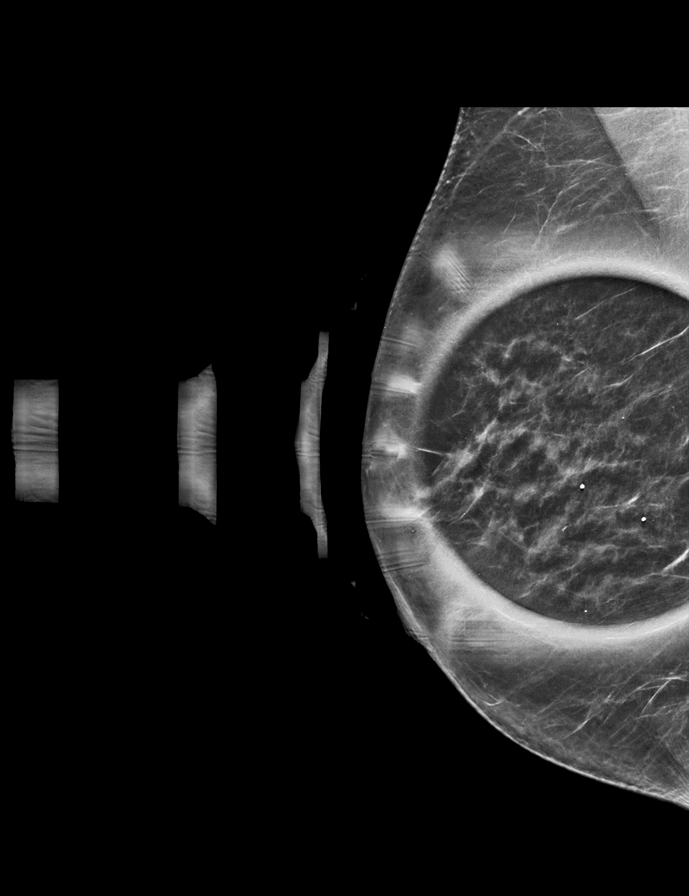

[L MLO tomo · tomo slice 29/56.0]
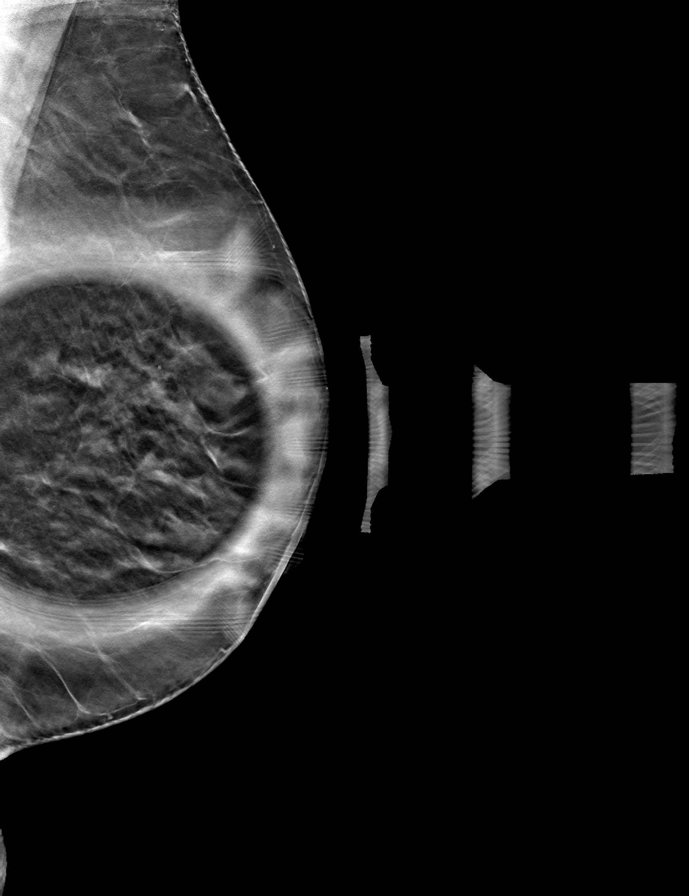

[R MLO tomo · tomo slice 28/55.0]
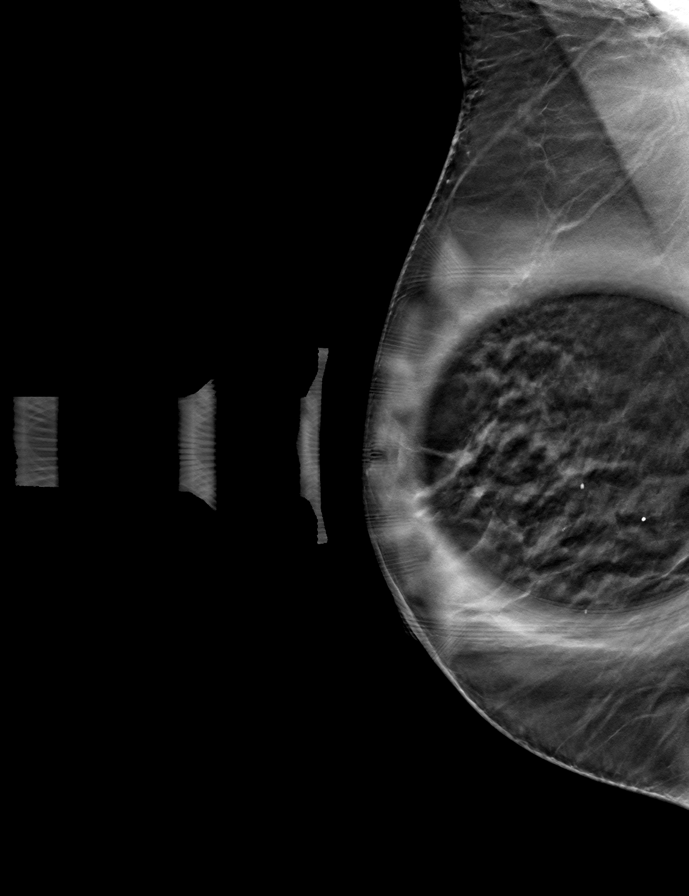

[R CC tomo · tomo slice 29/56.0]
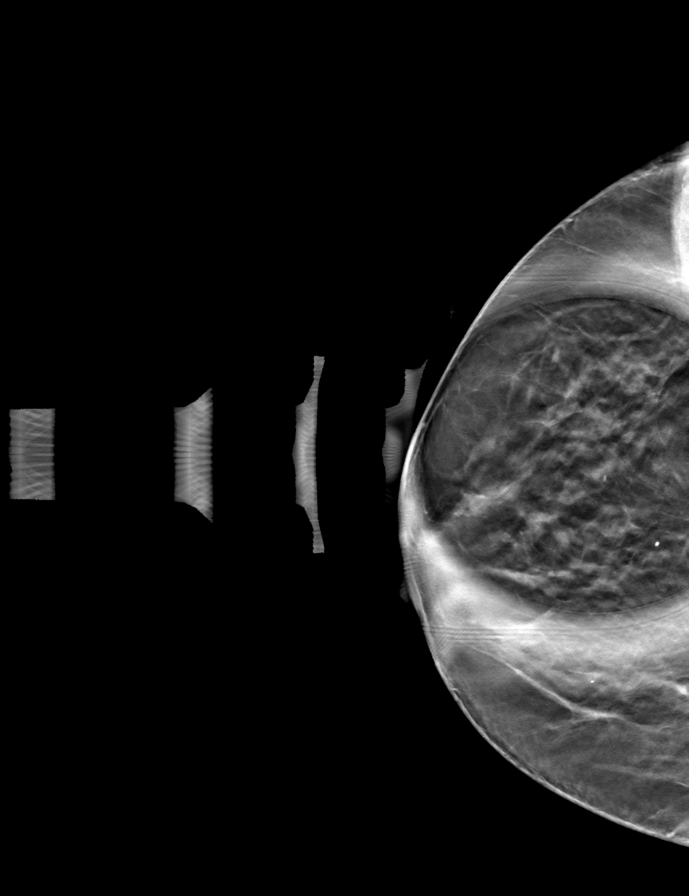

[L CC tomo · tomo slice 28/55.0]
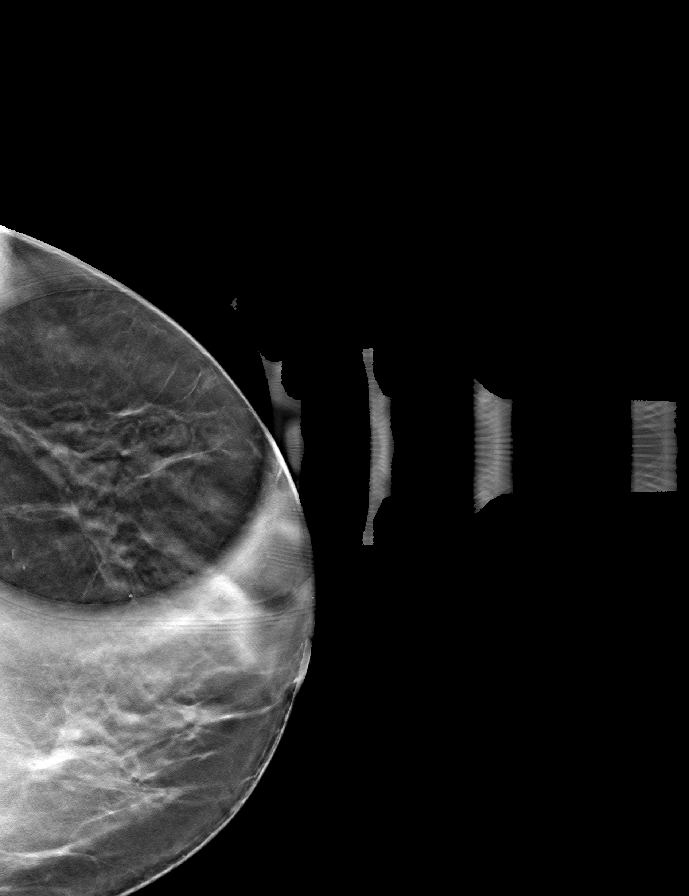

[8 of 24 positions shown; findings below may reference images not displayed]

ACR Breast Density Category c: The breast tissue is heterogeneously
dense, which may obscure small masses.
FINDINGS: Questioned asymmetry within the outer right breast partially effaced
with additional imaging suggestive of dense fibroglandular tissue.

There is a persistent lobular circumscribed mass within the outer
left breast posterior depth.

Targeted ultrasound is performed, showing a 6 x 3 x 6 mm cluster of
cysts left breast 3 o'clock position 7 cm from nipple.

Within the right breast 11 o'clock position, dense tissue is
visualized.
IMPRESSION: Probably benign left breast mass 3 o'clock position favored to
represent a cluster of cysts.

RECOMMENDATION:
Left breast ultrasound in 6 months to reassess the probably benign
left breast mass.

I have discussed the findings and recommendations with the patient.
If applicable, a reminder letter will be sent to the patient
regarding the next appointment.

BI-RADS CATEGORY  3: Probably benign.
